# Patient Record
Sex: Male | Born: 1960 | Race: Black or African American | Hispanic: No | Marital: Single | Smoking: Never smoker
Health system: Southern US, Community
[De-identification: ages and names within clinical notes are randomized; demographics above are authoritative.]

## PROBLEM LIST (undated history)

## (undated) DIAGNOSIS — E119 Type 2 diabetes mellitus without complications: Secondary | ICD-10-CM

## (undated) DIAGNOSIS — I10 Essential (primary) hypertension: Secondary | ICD-10-CM

---

## 2000-01-18 ENCOUNTER — Encounter: Payer: Self-pay | Admitting: Occupational Medicine

## 2000-01-18 ENCOUNTER — Encounter: Admission: RE | Admit: 2000-01-18 | Discharge: 2000-01-18 | Payer: Self-pay | Admitting: Occupational Medicine

## 2002-05-24 ENCOUNTER — Other Ambulatory Visit (HOSPITAL_COMMUNITY): Admission: RE | Admit: 2002-05-24 | Discharge: 2002-06-04 | Payer: Self-pay | Admitting: Psychiatry

## 2005-08-20 ENCOUNTER — Ambulatory Visit: Payer: Self-pay | Admitting: Psychiatry

## 2005-08-20 ENCOUNTER — Other Ambulatory Visit (HOSPITAL_COMMUNITY): Admission: RE | Admit: 2005-08-20 | Discharge: 2005-11-18 | Payer: Self-pay | Admitting: Psychiatry

## 2005-09-15 ENCOUNTER — Ambulatory Visit: Payer: Self-pay | Admitting: Psychiatry

## 2014-07-08 ENCOUNTER — Encounter: Payer: Self-pay | Admitting: Emergency Medicine

## 2014-07-08 ENCOUNTER — Emergency Department
Admission: EM | Admit: 2014-07-08 | Discharge: 2014-07-08 | Disposition: A | Discharge status: Home / Self Care | Payer: Managed Care, Other (non HMO) | Source: Home / Self Care | Attending: Emergency Medicine | Admitting: Emergency Medicine

## 2014-07-08 DIAGNOSIS — I10 Essential (primary) hypertension: Secondary | ICD-10-CM

## 2014-07-08 HISTORY — DX: Essential (primary) hypertension: I10

## 2014-07-08 MED ORDER — TELMISARTAN-HCTZ 80-12.5 MG PO TABS: 1.0000 | ORAL_TABLET | Freq: Every day | ORAL | Status: DC

## 2014-07-08 NOTE — ED Provider Notes (Signed)
CSN: [id number]     Arrival date & time 07/08/14  1127 History   First MD Initiated Contact with Patient 07/08/14 1151     Chief Complaint  Patient presents with  . Blood Pressure Check   (Consider location/radiation/quality/duration/timing/severity/associated sxs/prior Treatment) Patient is a 54 y.o. male presenting with hypertension. The history is provided by the patient. No language interpreter was used.  Hypertension This is a new problem. The problem occurs constantly. The problem has been gradually worsening. Pertinent negatives include no chest pain. Nothing aggravates the symptoms. Nothing relieves the symptoms. He has tried nothing for the symptoms. The treatment provided no relief.    No past medical history on file. No past surgical history on file. No family history on file. History  Substance Use Topics  . Smoking status: Not on file  . Smokeless tobacco: Not on file  . Alcohol Use: Not on file    Review of Systems  Cardiovascular: Negative for chest pain.  All other systems reviewed and are negative.   Allergies  Review of patient's allergies indicates not on file.  Home Medications   Prior to Admission medications   Not on File   There were no vitals taken for this visit. Physical Exam  Constitutional: He is oriented to person, place, and time. He appears well-developed and well-nourished.  HENT:  Head: Normocephalic and atraumatic.  Right Ear: External ear normal.  Nose: Nose normal.  Mouth/Throat: Oropharynx is clear and moist.  Eyes: EOM are normal.  Neck: Normal range of motion.  Cardiovascular: Normal rate and normal heart sounds.   Pulmonary/Chest: Effort normal.  Abdominal: He exhibits no distension.  Musculoskeletal: Normal range of motion.  Neurological: He is alert and oriented to person, place, and time.  Psychiatric: He has a normal mood and affect.  Nursing note and vitals reviewed.   ED Course  Procedures (including critical care  time) Labs Review Labs Reviewed - No data to display  Imaging Review No results found.   MDM   1. Essential hypertension      Rx for telmisartan-hctz 80-12.5 Follow up at family practice for recheck AVS    Elson Areas, PA-C 07/08/14 1210

## 2014-07-08 NOTE — ED Notes (Signed)
Pt states he is looking for a new pcp and has ran out of BP meds. Plans to see pcp next door.

## 2014-07-08 NOTE — Discharge Instructions (Signed)
DASH Eating Plan DASH stands for "Dietary Approaches to Stop Hypertension." The DASH eating plan is a healthy eating plan that has been shown to reduce high blood pressure (hypertension). Additional health benefits may include reducing the risk of type 2 diabetes mellitus, heart disease, and stroke. The DASH eating plan may also help with weight loss. WHAT DO I NEED TO KNOW ABOUT THE DASH EATING PLAN? For the DASH eating plan, you will follow these general guidelines:  Choose foods with a percent daily value for sodium of less than 5% (as listed on the food label).  Use salt-free seasonings or herbs instead of table salt or sea salt.  Check with your health care provider or pharmacist before using salt substitutes.  Eat lower-sodium products, often labeled as "lower sodium" or "no salt added."  Eat fresh foods.  Eat more vegetables, fruits, and low-fat dairy products.  Choose whole grains. Look for the word "whole" as the first word in the ingredient list.  Choose fish and skinless chicken or turkey more often than red meat. Limit fish, poultry, and meat to 6 oz (170 g) each day.  Limit sweets, desserts, sugars, and sugary drinks.  Choose heart-healthy fats.  Limit cheese to 1 oz (28 g) per day.  Eat more home-cooked food and less restaurant, buffet, and fast food.  Limit fried foods.  Cook foods using methods other than frying.  Limit canned vegetables. If you do use them, rinse them well to decrease the sodium.  When eating at a restaurant, ask that your food be prepared with less salt, or no salt if possible. WHAT FOODS CAN I EAT? Seek help from a dietitian for individual calorie needs. Grains Whole grain or whole wheat bread. Brown rice. Whole grain or whole wheat pasta. Quinoa, bulgur, and whole grain cereals. Low-sodium cereals. Corn or whole wheat flour tortillas. Whole grain cornbread. Whole grain crackers. Low-sodium crackers. Vegetables Fresh or frozen vegetables  (raw, steamed, roasted, or grilled). Low-sodium or reduced-sodium tomato and vegetable juices. Low-sodium or reduced-sodium tomato sauce and paste. Low-sodium or reduced-sodium canned vegetables.  Fruits All fresh, canned (in natural juice), or frozen fruits. Meat and Other Protein Products Ground beef (85% or leaner), grass-fed beef, or beef trimmed of fat. Skinless chicken or turkey. Ground chicken or turkey. Pork trimmed of fat. All fish and seafood. Eggs. Dried beans, peas, or lentils. Unsalted nuts and seeds. Unsalted canned beans. Dairy Low-fat dairy products, such as skim or 1% milk, 2% or reduced-fat cheeses, low-fat ricotta or cottage cheese, or plain low-fat yogurt. Low-sodium or reduced-sodium cheeses. Fats and Oils Tub margarines without trans fats. Light or reduced-fat mayonnaise and salad dressings (reduced sodium). Avocado. Safflower, olive, or canola oils. Natural peanut or almond butter. Other Unsalted popcorn and pretzels. The items listed above may not be a complete list of recommended foods or beverages. Contact your dietitian for more options. WHAT FOODS ARE NOT RECOMMENDED? Grains White bread. White pasta. White rice. Refined cornbread. Bagels and croissants. Crackers that contain trans fat. Vegetables Creamed or fried vegetables. Vegetables in a cheese sauce. Regular canned vegetables. Regular canned tomato sauce and paste. Regular tomato and vegetable juices. Fruits Dried fruits. Canned fruit in light or heavy syrup. Fruit juice. Meat and Other Protein Products Fatty cuts of meat. Ribs, chicken wings, bacon, sausage, bologna, salami, chitterlings, fatback, hot dogs, bratwurst, and packaged luncheon meats. Salted nuts and seeds. Canned beans with salt. Dairy Whole or 2% milk, cream, half-and-half, and cream cheese. Whole-fat or sweetened yogurt. Full-fat   cheeses or blue cheese. Nondairy creamers and whipped toppings. Processed cheese, cheese spreads, or cheese  curds. Condiments Onion and garlic salt, seasoned salt, table salt, and sea salt. Canned and packaged gravies. Worcestershire sauce. Tartar sauce. Barbecue sauce. Teriyaki sauce. Soy sauce, including reduced sodium. Steak sauce. Fish sauce. Oyster sauce. Cocktail sauce. Horseradish. Ketchup and mustard. Meat flavorings and tenderizers. Bouillon cubes. Hot sauce. Tabasco sauce. Marinades. Taco seasonings. Relishes. Fats and Oils Butter, stick margarine, lard, shortening, ghee, and bacon fat. Coconut, palm kernel, or palm oils. Regular salad dressings. Other Pickles and olives. Salted popcorn and pretzels. The items listed above may not be a complete list of foods and beverages to avoid. Contact your dietitian for more information. WHERE CAN I FIND MORE INFORMATION? National Heart, Lung, and [employer] Institute: www.nhlbi.nih.gov/health/health-topics/topics/dash/ Document Released: 12/17/2010 Document Revised: 05/14/2013 Document Reviewed: 11/01/2012 ExitCare Patient Information 2015 [employer]. This information is not intended to replace advice given to you by your health care provider. Make sure you discuss any questions you have with your health care provider. Hypertension Hypertension, commonly called high blood pressure, is when the force of blood pumping through your arteries is too strong. Your arteries are the blood vessels that carry blood from your heart throughout your body. A blood pressure reading consists of a higher number over a lower number, such as 110/72. The higher number (systolic) is the pressure inside your arteries when your heart pumps. The lower number (diastolic) is the pressure inside your arteries when your heart relaxes. Ideally you want your blood pressure below 120/80. Hypertension forces your heart to work harder to pump blood. Your arteries may become narrow or stiff. Having hypertension puts you at risk for heart disease, stroke, and other problems.  RISK  FACTORS Some risk factors for high blood pressure are controllable. Others are not.  Risk factors you cannot control include:   Race. You may be at higher risk if you are African American.  Age. Risk increases with age.  Gender. Men are at higher risk than women before age 45 years. After age 65, women are at higher risk than men. Risk factors you can control include:  Not getting enough exercise or physical activity.  Being overweight.  Getting too much fat, sugar, calories, or salt in your diet.  Drinking too much alcohol. SIGNS AND SYMPTOMS Hypertension does not usually cause signs or symptoms. Extremely high blood pressure (hypertensive crisis) may cause headache, anxiety, shortness of breath, and nosebleed. DIAGNOSIS  To check if you have hypertension, your health care provider will measure your blood pressure while you are seated, with your arm held at the level of your heart. It should be measured at least twice using the same arm. Certain conditions can cause a difference in blood pressure between your right and left arms. A blood pressure reading that is higher than normal on one occasion does not mean that you need treatment. If one blood pressure reading is high, ask your health care provider about having it checked again. TREATMENT  Treating high blood pressure includes making lifestyle changes and possibly taking medicine. Living a healthy lifestyle can help lower high blood pressure. You may need to change some of your habits. Lifestyle changes may include:  Following the DASH diet. This diet is high in fruits, vegetables, and whole grains. It is low in salt, red meat, and added sugars.  Getting at least 2 hours of brisk physical activity every week.  Losing weight if necessary.  Not smoking.  Limiting   alcoholic beverages.  Learning ways to reduce stress. If lifestyle changes are not enough to get your blood pressure under control, your health care provider may  prescribe medicine. You may need to take more than one. Work closely with your health care provider to understand the risks and benefits. HOME CARE INSTRUCTIONS  Have your blood pressure rechecked as directed by your health care provider.   Take medicines only as directed by your health care provider. Follow the directions carefully. Blood pressure medicines must be taken as prescribed. The medicine does not work as well when you skip doses. Skipping doses also puts you at risk for problems.   Do not smoke.   Monitor your blood pressure at home as directed by your health care provider. SEEK MEDICAL CARE IF:   You think you are having a reaction to medicines taken.  You have recurrent headaches or feel dizzy.  You have swelling in your ankles.  You have trouble with your vision. SEEK IMMEDIATE MEDICAL CARE IF:  You develop a severe headache or confusion.  You have unusual weakness, numbness, or feel faint.  You have severe chest or abdominal pain.  You vomit repeatedly.  You have trouble breathing. MAKE SURE YOU:   Understand these instructions.  Will watch your condition.  Will get help right away if you are not doing well or get worse. Document Released: 12/28/2004 Document Revised: 05/14/2013 Document Reviewed: 10/20/2012 ExitCare Patient Information 2015 [employer]. This information is not intended to replace advice given to you by your health care provider. Make sure you discuss any questions you have with your health care provider.  

## 2014-07-25 ENCOUNTER — Ambulatory Visit: Payer: Managed Care, Other (non HMO) | Admitting: Family Medicine

## 2015-09-15 ENCOUNTER — Encounter: Payer: Self-pay | Admitting: *Deleted

## 2015-09-15 ENCOUNTER — Emergency Department
Admission: EM | Admit: 2015-09-15 | Discharge: 2015-09-15 | Disposition: A | Discharge status: Home / Self Care | Payer: BLUE CROSS/BLUE SHIELD | Source: Home / Self Care | Attending: Family Medicine | Admitting: Family Medicine

## 2015-09-15 DIAGNOSIS — Z760 Encounter for issue of repeat prescription: Secondary | ICD-10-CM | POA: Diagnosis not present

## 2015-09-15 DIAGNOSIS — I10 Essential (primary) hypertension: Secondary | ICD-10-CM

## 2015-09-15 HISTORY — DX: Type 2 diabetes mellitus without complications: E11.9

## 2015-09-15 MED ORDER — LOSARTAN POTASSIUM 100 MG PO TABS: 100.0000 mg | ORAL_TABLET | Freq: Every day | ORAL | 0 refills | Status: AC

## 2015-09-15 MED ORDER — HYDROCHLOROTHIAZIDE 25 MG PO TABS: 25.0000 mg | ORAL_TABLET | Freq: Every day | ORAL | 0 refills | Status: AC

## 2015-09-15 MED ORDER — AMLODIPINE BESYLATE 10 MG PO TABS: 10.0000 mg | ORAL_TABLET | Freq: Every day | ORAL | 0 refills | Status: AC

## 2015-09-15 NOTE — ED Triage Notes (Signed)
Pt is out of his antihypertensive meds x 2 days.  [facility]

## 2015-09-15 NOTE — ED Provider Notes (Signed)
CSN: [id number]     Arrival date & time 09/15/15  1323 History   First MD Initiated Contact with Patient 09/15/15 1352     Chief Complaint  Patient presents with  . Hypertension   (Consider location/radiation/quality/duration/timing/severity/associated sxs/prior Treatment) HPI Nicholas Boyle is a 55 y.o. male presenting to UC with request for medication refill. Pt has hx of HTN and notes he has been out of his Losartan and Amlodipine for about 2 days. [facility] but states it could still take about 5-7 business days for him to receive a refill of his medications. Denies symptoms including headache, dizziness, chest pain or SOB.   Past Medical History:  Diagnosis Date  . Diabetes mellitus without complication (HCC)   . Hypertension    History reviewed. No pertinent surgical history. History reviewed. No pertinent family history. Social History  Substance Use Topics  . Smoking status: Never Smoker  . Smokeless tobacco: Never Used  . Alcohol use Yes    Review of Systems  Eyes: Negative for photophobia, pain and visual disturbance.  Respiratory: Negative for cough, chest tightness and shortness of breath.   Cardiovascular: Negative for chest pain, palpitations and leg swelling.  Gastrointestinal: Negative for diarrhea, nausea and vomiting.  Neurological: Negative for dizziness, light-headedness and headaches.    Allergies  Review of patient's allergies indicates no known allergies.  Home Medications   Prior to Admission medications   Medication Sig Start Date End Date Taking? Authorizing Provider  metFORMIN (GLUCOPHAGE) 850 MG tablet Take 850 mg by mouth 2 (two) times daily with a meal.   Yes Historical Provider, MD  amLODipine (NORVASC) 10 MG tablet Take 1 tablet (10 mg total) by mouth daily. 09/15/15   Junius FinnerErin O'Malley, PA-C  hydrochlorothiazide (HYDRODIURIL) 25 MG tablet Take 1 tablet (25 mg total) by mouth daily. 09/15/15   Junius FinnerErin O'Malley, PA-C  hydrochlorothiazide  (MICROZIDE) 12.5 MG capsule Take 12.5 mg by mouth daily.    Historical Provider, MD  losartan (COZAAR) 100 MG tablet Take 1 tablet (100 mg total) by mouth daily. 09/15/15   Junius FinnerErin O'Malley, PA-C   Meds Ordered and Administered this Visit  Medications - No data to display  BP 147/91 (BP Location: Left Arm)   Pulse 93   Temp 97.8 F (36.6 C) (Oral)   Resp 16   Ht 6' (1.829 m)   Wt 291 lb (132 kg)   SpO2 98%   BMI 39.47 kg/m  No data found.   Physical Exam  Constitutional: He is oriented to person, place, and time. He appears well-developed and well-nourished. No distress.  Pt sitting on exam bed, NAD  HENT:  Head: Normocephalic and atraumatic.  Eyes: EOM are normal.  Neck: Normal range of motion.  Cardiovascular: Normal rate and regular rhythm.   Pulmonary/Chest: Effort normal and breath sounds normal. No respiratory distress. He has no wheezes. He has no rales.  Musculoskeletal: Normal range of motion.  Neurological: He is alert and oriented to person, place, and time.  Skin: Skin is warm and dry. He is not diaphoretic.  Psychiatric: He has a normal mood and affect. His behavior is normal.  Nursing note and vitals reviewed.   Urgent Care Course   Clinical Course    Procedures (including critical care time)  Labs Review Labs Reviewed - No data to display  Imaging Review No results found.   MDM   1. Medication refill   2. Essential hypertension    Pt requesting refill  on his BP medications. Denies having symptoms.  Pt appears well. BP- 147/91. [facility].   Refill on HCTZ 25mg , Losartan 25mg , and Amlodipine 10mg  for 14 days.     Junius Finner, PA-C 09/15/15 1423

## 2015-09-30 ENCOUNTER — Ambulatory Visit: Payer: BLUE CROSS/BLUE SHIELD | Admitting: Family Medicine

## 2015-10-07 ENCOUNTER — Ambulatory Visit: Payer: BLUE CROSS/BLUE SHIELD | Admitting: Family Medicine

## 2018-02-19 ENCOUNTER — Emergency Department (HOSPITAL_COMMUNITY): Payer: 59

## 2018-02-19 ENCOUNTER — Emergency Department (HOSPITAL_COMMUNITY)
Admission: EM | Admit: 2018-02-19 | Discharge: 2018-02-19 | Disposition: A | Discharge status: Home / Self Care | Payer: 59 | Attending: Emergency Medicine | Admitting: Emergency Medicine

## 2018-02-19 ENCOUNTER — Encounter (HOSPITAL_COMMUNITY): Payer: Self-pay | Admitting: Emergency Medicine

## 2018-02-19 DIAGNOSIS — I10 Essential (primary) hypertension: Secondary | ICD-10-CM | POA: Insufficient documentation

## 2018-02-19 DIAGNOSIS — Z7984 Long term (current) use of oral hypoglycemic drugs: Secondary | ICD-10-CM | POA: Diagnosis not present

## 2018-02-19 DIAGNOSIS — J029 Acute pharyngitis, unspecified: Secondary | ICD-10-CM | POA: Diagnosis present

## 2018-02-19 DIAGNOSIS — Z79899 Other long term (current) drug therapy: Secondary | ICD-10-CM | POA: Insufficient documentation

## 2018-02-19 DIAGNOSIS — J020 Streptococcal pharyngitis: Secondary | ICD-10-CM

## 2018-02-19 DIAGNOSIS — J36 Peritonsillar abscess: Secondary | ICD-10-CM | POA: Diagnosis not present

## 2018-02-19 DIAGNOSIS — E119 Type 2 diabetes mellitus without complications: Secondary | ICD-10-CM | POA: Diagnosis not present

## 2018-02-19 LAB — CBC WITH DIFFERENTIAL/PLATELET
Abs Immature Granulocytes: 0.19 10*3/uL — ABNORMAL HIGH (ref 0.00–0.07)
Basophils Absolute: 0.1 10*3/uL (ref 0.0–0.1)
Basophils Relative: 0 %
Eosinophils Absolute: 0 10*3/uL (ref 0.0–0.5)
Eosinophils Relative: 0 %
HCT: 43.5 % (ref 39.0–52.0)
HEMOGLOBIN: 14.5 g/dL (ref 13.0–17.0)
Immature Granulocytes: 1 %
Lymphocytes Relative: 11 %
Lymphs Abs: 2.1 10*3/uL (ref 0.7–4.0)
MCH: 28.5 pg (ref 26.0–34.0)
MCHC: 33.3 g/dL (ref 30.0–36.0)
MCV: 85.6 fL (ref 80.0–100.0)
Monocytes Absolute: 2 10*3/uL — ABNORMAL HIGH (ref 0.1–1.0)
Monocytes Relative: 10 %
Neutro Abs: 14.5 10*3/uL — ABNORMAL HIGH (ref 1.7–7.7)
Neutrophils Relative %: 78 %
Platelets: 386 10*3/uL (ref 150–400)
RBC: 5.08 MIL/uL (ref 4.22–5.81)
RDW: 12.5 % (ref 11.5–15.5)
WBC: 18.8 10*3/uL — ABNORMAL HIGH (ref 4.0–10.5)
nRBC: 0 % (ref 0.0–0.2)

## 2018-02-19 LAB — BASIC METABOLIC PANEL
Anion gap: 14 (ref 5–15)
BUN: 7 mg/dL (ref 6–20)
CO2: 21 mmol/L — ABNORMAL LOW (ref 22–32)
CREATININE: 0.83 mg/dL (ref 0.61–1.24)
Calcium: 8.9 mg/dL (ref 8.9–10.3)
Chloride: 98 mmol/L (ref 98–111)
GFR calc Af Amer: >60 mL/min (ref >60)
GFR calc non Af Amer: >60 mL/min (ref >60)
Glucose, Bld: 264 mg/dL — ABNORMAL HIGH (ref 70–99)
Potassium: 3.8 mmol/L (ref 3.5–5.1)
Sodium: 133 mmol/L — ABNORMAL LOW (ref 135–145)

## 2018-02-19 LAB — GROUP A STREP BY PCR: Group A Strep by PCR: DETECTED — AB

## 2018-02-19 MED ORDER — CLINDAMYCIN PHOSPHATE 300 MG/50ML IV SOLN
300.0000 mg | Freq: Once | INTRAVENOUS | Status: AC
  Administered 2018-02-19: 300 mg via INTRAVENOUS
  Filled 2018-02-19: qty 50

## 2018-02-19 MED ORDER — CLINDAMYCIN HCL 300 MG PO CAPS: 300.0000 mg | ORAL_CAPSULE | Freq: Four times a day (QID) | ORAL | 0 refills | Status: AC

## 2018-02-19 MED ORDER — SODIUM CHLORIDE 0.9 % IV BOLUS
1000.0000 mL | Freq: Once | INTRAVENOUS | Status: AC
  Administered 2018-02-19: 1000 mL via INTRAVENOUS

## 2018-02-19 MED ORDER — MORPHINE SULFATE (PF) 4 MG/ML IV SOLN
4.0000 mg | Freq: Once | INTRAVENOUS | Status: AC
  Administered 2018-02-19: 4 mg via INTRAVENOUS
  Filled 2018-02-19: qty 1

## 2018-02-19 MED ORDER — ONDANSETRON HCL 4 MG/2ML IJ SOLN
4.0000 mg | Freq: Once | INTRAMUSCULAR | Status: AC
  Administered 2018-02-19: 4 mg via INTRAVENOUS
  Filled 2018-02-19: qty 2

## 2018-02-19 MED ORDER — HYDROCODONE-ACETAMINOPHEN 7.5-325 MG/15ML PO SOLN: 15.0000 mL | Freq: Four times a day (QID) | ORAL | 0 refills | Status: AC | PRN

## 2018-02-19 MED ORDER — IOHEXOL 300 MG/ML  SOLN
75.0000 mL | Freq: Once | INTRAMUSCULAR | Status: AC | PRN
  Administered 2018-02-19: 75 mL via INTRAVENOUS

## 2018-02-19 MED ORDER — DEXAMETHASONE SODIUM PHOSPHATE 10 MG/ML IJ SOLN
10.0000 mg | Freq: Once | INTRAMUSCULAR | Status: AC
  Administered 2018-02-19: 10 mg via INTRAVENOUS
  Filled 2018-02-19: qty 1

## 2018-02-19 MED ORDER — CLINDAMYCIN PHOSPHATE 600 MG/50ML IV SOLN
600.0000 mg | Freq: Once | INTRAVENOUS | Status: AC
  Administered 2018-02-19: 600 mg via INTRAVENOUS
  Filled 2018-02-19: qty 50

## 2018-02-19 NOTE — ED Notes (Signed)
Patient verbalizes understanding of discharge instructions. Opportunity for questioning and answers were provided. 

## 2018-02-19 NOTE — ED Notes (Signed)
Nicholas Boyle in lab advised this Clinical research associate that BMP was hemolyzed and needs to be redrawn

## 2018-02-19 NOTE — ED Notes (Signed)
Pt provided with ice water

## 2018-02-19 NOTE — ED Notes (Signed)
Patient transported to CT 

## 2018-02-19 NOTE — Discharge Instructions (Signed)
Sure you are drinking plenty of fluids to stay hydrated and take your antibiotic 4 times a day.  You can take the pain medication syrup as needed.  Do not return to work until you are no longer needing the pain medication.

## 2018-02-19 NOTE — ED Triage Notes (Signed)
states has sore throat now  Vomited  Earlier in the week and felt weak  but has been able to eat  since Thursday, has a spot on left index finger that has been weaping

## 2018-02-19 NOTE — ED Provider Notes (Addendum)
[facility] EMERGENCY DEPARTMENT Provider Note   CSN: [id number] Arrival date & time: 02/19/18  7106     History   Chief Complaint Chief Complaint  Patient presents with  . Sore Throat  . Finger Injury    HPI Nicholas Boyle is a 58 y.o. male.  Patient is a 58 year old male with a history of hypertension and diabetes presenting today with multiple symptoms.  Patient states between Tuesday and Wednesday of this week he had severe emesis and unable to hold anything down.  For approximately 24 hours he had multiple episodes of vomiting and general malaise.  He does not think he had a fever at the time and denies any diarrhea.  However on Thursday he was starting to feel better and was eating and drinking again.  However Friday he developed a sore throat that has significantly worsened until today.  He states it is very difficult to swallow and he is having trouble swallowing but denies any shortness of breath.  The left side is more painful than the right.  He denies any chest pain, abdominal pain or any further emesis. Secondly around Tuesday also patient noticed an area of swelling and a pustule on his left index finger.  He does not know if he was bit by anything but states initially the finger swelled up and was extremely red but 2 days ago he popped the area with significant purulent drainage and his finger now feels significantly better and the swelling has completely resolved.  The history is provided by the patient.  Sore Throat  This is a new problem. The current episode started 2 days ago. The problem occurs constantly. The problem has been gradually worsening. The symptoms are aggravated by swallowing. Nothing relieves the symptoms. He has tried nothing for the symptoms. The treatment provided no relief.    Past Medical History:  Diagnosis Date  . Diabetes mellitus without complication (HCC)   . Hypertension     There are no active problems to display for this  patient.   History reviewed. No pertinent surgical history.      Home Medications    Prior to Admission medications   Medication Sig Start Date End Date Taking? Authorizing Provider  amLODipine (NORVASC) 10 MG tablet Take 1 tablet (10 mg total) by mouth daily. 09/15/15   Lurene Shadow, PA-C  hydrochlorothiazide (HYDRODIURIL) 25 MG tablet Take 1 tablet (25 mg total) by mouth daily. 09/15/15   Lurene Shadow, PA-C  hydrochlorothiazide (MICROZIDE) 12.5 MG capsule Take 12.5 mg by mouth daily.    [provider]  losartan (COZAAR) 100 MG tablet Take 1 tablet (100 mg total) by mouth daily. 09/15/15   Lurene Shadow, PA-C  metFORMIN (GLUCOPHAGE) 850 MG tablet Take 850 mg by mouth 2 (two) times daily with a meal.    [provider]    Family History No family history on file.  Social History Social History   Tobacco Use  . Smoking status: Never Smoker  . Smokeless tobacco: Never Used  Substance Use Topics  . Alcohol use: Yes  . Drug use: No     Allergies   Patient has no known allergies.   Review of Systems Review of Systems  All other systems reviewed and are negative.    Physical Exam Updated Vital Signs BP (!) 150/103   Pulse (!) 112   Temp 99.2 F (37.3 C) (Oral)   Resp 18   Ht 6\' 1"  (1.854 m)  Wt 115.7 kg   SpO2 96%   BMI 33.64 kg/m   Physical Exam Vitals signs and nursing note reviewed.  Constitutional:      General: He is not in acute distress.    Appearance: He is well-developed. He is not ill-appearing.  HENT:     Head: Normocephalic and atraumatic.     Right Ear: Tympanic membrane normal.     Left Ear: Tympanic membrane normal.     Nose: No congestion.     Mouth/Throat:     Pharynx: Pharyngeal swelling and posterior oropharyngeal erythema present.      Comments: Significant swelling and difficult visualization of the posterior pharynx.  Bulging in the left tonsillar pillar area but no uvula deviation.  No stidor but hot potato  voice Eyes:     Conjunctiva/sclera: Conjunctivae normal.     Pupils: Pupils are equal, round, and reactive to light.  Neck:     Musculoskeletal: Normal range of motion and neck supple.  Cardiovascular:     Rate and Rhythm: Regular rhythm. Tachycardia present.     Pulses: Normal pulses.     Heart sounds: No murmur.  Pulmonary:     Effort: Pulmonary effort is normal. No respiratory distress.     Breath sounds: Normal breath sounds. No wheezing or rales.  Abdominal:     General: There is no distension.     Palpations: Abdomen is soft.     Tenderness: There is no abdominal tenderness. There is no guarding or rebound.  Musculoskeletal: Normal range of motion.        General: No tenderness.       Hands:  Lymphadenopathy:     Cervical: Cervical adenopathy present.  Skin:    General: Skin is warm and dry.     Findings: No erythema or rash.  Neurological:     General: No focal deficit present.     Mental Status: He is alert and oriented to person, place, and time.  Psychiatric:        Mood and Affect: Mood normal.        Behavior: Behavior normal.      ED Treatments / Results  Labs (all labs ordered are listed, but only abnormal results are displayed) Labs Reviewed  GROUP A STREP BY PCR - Abnormal; Notable for the following components:      Result Value   Group A Strep by PCR DETECTED (*)    All other components within normal limits  CBC WITH DIFFERENTIAL/PLATELET - Abnormal; Notable for the following components:   WBC 18.8 (*)    Neutro Abs 14.5 (*)    Monocytes Absolute 2.0 (*)    Abs Immature Granulocytes 0.19 (*)    All other components within normal limits  BASIC METABOLIC PANEL - Abnormal; Notable for the following components:   Sodium 133 (*)    CO2 21 (*)    Glucose, Bld 264 (*)    All other components within normal limits    EKG None  Radiology Ct Soft Tissue Neck W Contrast  Result Date: 02/19/2018 CLINICAL DATA:  Sore throat/stridor EXAM: CT NECK WITH  CONTRAST TECHNIQUE: Multidetector CT imaging of the neck was performed using the standard protocol following the bolus administration of intravenous contrast. CONTRAST:  7mL OMNIPAQUE IOHEXOL 300 MG/ML  SOLN COMPARISON:  None. FINDINGS: Pharynx and larynx: Thickened tonsils with 13 mm left peritonsillar collection. There is preferential left-sided submucosal edema in the oropharynx that continues into the supraglottic larynx with aryepiglottic and  left lateral pharyngeal wall thickening. Mild retropharyngeal edema. Salivary glands: No inflammation, mass, or stone. Thyroid: Negative Lymph nodes: Enlarged jugular nodes without cavitation. Vascular: No significant finding. Limited intracranial: Negative Visualized orbits: Negative Mastoids and visualized paranasal sinuses: Small presumed retention cysts in the maxillary sinuses. No acute inflammation. Skeleton: Spondylosis. Upper chest: Negative IMPRESSION: 1. Tonsillitis with 13 mm left peritonsillar abscess. Submucosal edema is present in the left-sided oropharynx and supraglottic larynx. 2. Mild retropharyngeal edema. 3. Cervical adenitis. Electronically Signed   By: Marnee SpringJonathon  Watts M.D.   On: 02/19/2018 12:27    Procedures Procedures (including critical care time)  Medications Ordered in ED Medications  sodium chloride 0.9 % bolus 1,000 mL (has no administration in time range)  clindamycin (CLEOCIN) IVPB 600 mg (has no administration in time range)  ondansetron (ZOFRAN) injection 4 mg (has no administration in time range)  morphine 4 MG/ML injection 4 mg (has no administration in time range)     Initial Impression / Assessment and Plan / ED Course  I have reviewed the triage vital signs and the nursing notes.  Pertinent labs & imaging results that were available during my care of the patient were reviewed by me and considered in my medical decision making (see chart for details).     Patient presenting today with severe sore throat and  concern for possible PTA.  Lower likelihood for RPA or epiglottitis.  Patient is not stridorous but does have a mild muffled voice.  He has a temperature of 99.  He is tachycardic.  He has been having trouble swallowing but denies any shortness of breath.  Patient was given IV fluids and pain control.  He was also given a dose of IV clindamycin.  In addition he does have what appears to be a healing abscess on his left index finger but the clindamycin should also cover for that.  CBC, BMP and strep PCR pending.  We will get a CT soft tissue neck with contrast to further evaluate.  10:58 AM Pt is strep positive with leukocytosis of 18,000.  12:31 PM Patient's BMP shows a hyperglycemia of 264 but otherwise within normal limits.  Patient had a CT of the throat that showed a 13 mm left peritonsillar abscess.  Will discuss with ENT.  Patient made aware of these findings.  12:57 PM Spoke with Dr. Suszanne Connerseoh who wanted pt to have 900mg  of clindamycin so he was given 300mg  more and 300mg  qid for 10 days.  Pt also given decadron here but feels this will most likely resolve with abx.  Final Clinical Impressions(s) / ED Diagnoses   Final diagnoses:  Peritonsillar abscess  Pharyngitis due to Streptococcus species    ED Discharge Orders         Ordered    clindamycin (CLEOCIN) 300 MG capsule  4 times daily     02/19/18 1428    HYDROcodone-acetaminophen (HYCET) 7.5-325 mg/15 ml solution  4 times daily PRN     02/19/18 1428           Gwyneth SproutPlunkett, Greg Eckrich, MD 02/19/18 1233    Gwyneth SproutPlunkett, Viviann Broyles, MD 02/19/18 1523

## 2018-04-24 ENCOUNTER — Telehealth (INDEPENDENT_AMBULATORY_CARE_PROVIDER_SITE_OTHER): Payer: 59 | Admitting: Emergency Medicine

## 2018-04-24 ENCOUNTER — Encounter: Payer: Self-pay | Admitting: Emergency Medicine

## 2018-04-24 ENCOUNTER — Other Ambulatory Visit: Payer: Self-pay

## 2018-04-24 DIAGNOSIS — I10 Essential (primary) hypertension: Secondary | ICD-10-CM

## 2018-04-24 DIAGNOSIS — E1159 Type 2 diabetes mellitus with other circulatory complications: Secondary | ICD-10-CM | POA: Diagnosis not present

## 2018-04-24 DIAGNOSIS — E1165 Type 2 diabetes mellitus with hyperglycemia: Secondary | ICD-10-CM | POA: Diagnosis not present

## 2018-04-24 NOTE — Progress Notes (Signed)
Called patient for Virtual triage, he wants to establish care. Patient has hypertension and diabetes type 2, he is taking medication for this. He get his medications from the [employer]. Patient test glucose everyday sometimes, last Thursday his morning glucose before eating was 180.

## 2018-04-24 NOTE — Progress Notes (Signed)
Telemedicine Encounter- SOAP NOTE Established Patient  This telephone encounter was conducted with the patient's (or proxy's) verbal consent via audio telecommunications: yes/no: Yes Patient was instructed to have this encounter in a suitably private space; and to only have persons present to whom they give permission to participate. In addition, patient identity was confirmed by use of name plus two identifiers (DOB and address).  I discussed the limitations, risks, security and privacy concerns of performing an evaluation and management service by telephone and the availability of in person appointments. I also discussed with the patient that there may be a patient responsible charge related to this service. The patient expressed understanding and agreed to proceed.  I spent a total of TIME; 0 MIN TO 60 MIN: 20 minutes talking with the patient or their proxy.  No chief complaint on file. Establish care  Subjective   Nicholas Boyle is a [age] y.o. male.  First visit with this office.  Telephone visit today to establish care.  Has a history of hypertension and diabetes on medications.  Blood glucose at home has been between 100- 180 and blood pressure has been 129-135/78-84 range.  Compliant with medications.  Has no complaints or medical concerns today. Had colonoscopy done last September 2019 which showed a small polyp.  Told to follow-up in 3 years. Non-smoker and no EtOH abuser.  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  HPI   There are no active problems to display for this patient.   Past Medical History:  Diagnosis Date  . Diabetes mellitus without complication (HCC)   . Hypertension     Current Outpatient Medications  Medication Sig Dispense Refill  . Acetaminophen (TYLENOL PO) Take by mouth as needed.    Marland Kitchen. amLODipine (NORVASC) 10 MG tablet Take 1 tablet (10 mg total) by mouth daily. 14 tablet 0  . hydrochlorothiazide (HYDRODIURIL) 25 MG tablet Take 1  tablet (25 mg total) by mouth daily. 14 tablet 0  . losartan (COZAAR) 100 MG tablet Take 1 tablet (100 mg total) by mouth daily. 14 tablet 0  . metFORMIN (GLUCOPHAGE) 850 MG tablet Take 850 mg by mouth 2 (two) times daily with a meal.    . clindamycin (CLEOCIN) 300 MG capsule Take 1 capsule (300 mg total) by mouth 4 (four) times daily. (Patient not taking: Reported on 04/24/2018) 40 capsule 0  . hydrochlorothiazide (MICROZIDE) 12.5 MG capsule Take 12.5 mg by mouth daily.    Marland Kitchen. HYDROcodone-acetaminophen (HYCET) 7.5-325 mg/15 ml solution Take 15 mLs by mouth 4 (four) times daily as needed for moderate pain. (Patient not taking: Reported on 04/24/2018) 120 mL 0   No current facility-administered medications for this visit.     Allergies  Allergen Reactions  . Lisinopril Anaphylaxis    Per patient felt like throat was swelling up  . Atorvastatin Other (See Comments)    Back pain    Social History   Socioeconomic History  . Marital status: Single    Spouse name: Not on file  . Number of children: Not on file  . Years of education: Not on file  . Highest education level: Not on file  Occupational History  . Not on file  Social Needs  . Financial resource strain: Not on file  . Food insecurity:    Worry: Not on file    Inability: Not on file  . Transportation needs:    Medical: Not on file    Non-medical: Not on file  Tobacco Use  .  Smoking status: Never Smoker  . Smokeless tobacco: Never Used  Substance and Sexual Activity  . Alcohol use: Yes  . Drug use: No  . Sexual activity: Not on file  Lifestyle  . Physical activity:    Days per week: Not on file    Minutes per session: Not on file  . Stress: Not on file  Relationships  . Social connections:    Talks on phone: Not on file    Gets together: Not on file    Attends religious service: Not on file    Active member of club or organization: Not on file    Attends meetings of clubs or organizations: Not on file     Relationship status: Not on file  . Intimate partner violence:    Fear of current or ex partner: Not on file    Emotionally abused: Not on file    Physically abused: Not on file    Forced sexual activity: Not on file  Other Topics Concern  . Not on file  Social History Narrative  . Not on file    Review of Systems  Constitutional: Negative.  Negative for chills and fever.  HENT: Negative for congestion, nosebleeds and sore throat.   Eyes: Negative for blurred vision and double vision.  Respiratory: Negative.  Negative for cough and shortness of breath.   Cardiovascular: Negative.  Negative for chest pain and palpitations.  Gastrointestinal: Negative.  Negative for abdominal pain, diarrhea, nausea and vomiting.  Genitourinary: Negative.  Negative for dysuria and hematuria.  Musculoskeletal: Positive for back pain (Chronic back pain) and joint pain (Chronic joint pains).  Skin: Negative.  Negative for rash.  Neurological: Negative for dizziness and headaches.  All other systems reviewed and are negative.   Objective   Vitals as reported by the patient: Blood pressure: 130/80, self-reported. Awake and oriented x3 in no apparent respiratory distress while talking to me on the phone. There were no vitals filed for this visit.  There are no diagnoses linked to this encounter. Clinically stable.  No medical concerns identified during this visit.  Continue present medications.  Follow-up in 3 months. Diagnoses and all orders for this visit:  Essential hypertension  Type 2 diabetes mellitus with hyperglycemia, without long-term current use of insulin (HCC)  Hypertension associated with type 2 diabetes mellitus (HCC)    I discussed the assessment and treatment plan with the patient. The patient was provided an opportunity to ask questions and all were answered. The patient agreed with the plan and demonstrated an understanding of the instructions.   The patient was advised to call  back or seek an in-person evaluation if the symptoms worsen or if the condition fails to improve as anticipated.  I provided 20 minutes of non-face-to-face time during this encounter.  Georgina Quint, MD  Primary Care at [facility]

## 2018-07-24 ENCOUNTER — Ambulatory Visit: Payer: 59 | Admitting: Emergency Medicine

## 2018-07-25 ENCOUNTER — Encounter: Payer: Self-pay | Admitting: Emergency Medicine

## 2020-05-18 IMAGING — CT CT NECK W/ CM
5 of 6 series · 14 of 33 positions shown, 16 images · IV contrast (APPLIED)
Comparison: None.

CLINICAL DATA: Sore throat/stridor

EXAM:
CT NECK WITH CONTRAST
TECHNIQUE: Multidetector CT imaging of the neck was performed using the
standard protocol following the bolus administration of intravenous
contrast.
CONTRAST:  75mL OMNIPAQUE IOHEXOL 300 MG/ML  SOLN

[Series 3: axial neck · axial · 0.54mm/px · z∈[+1053,+1137]mm · 2 of 128 slices shown]
[im 43/128  bone]
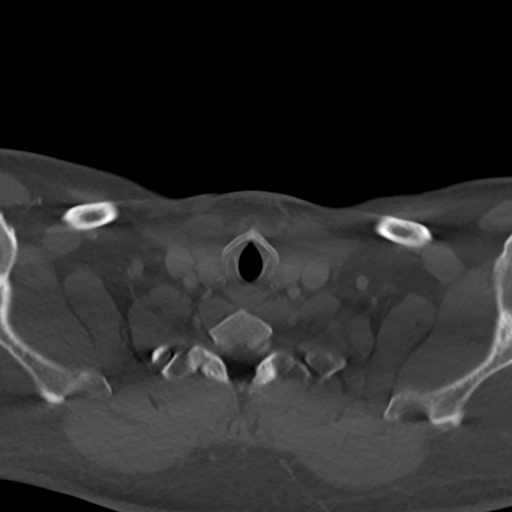
[im 85/128  bone]
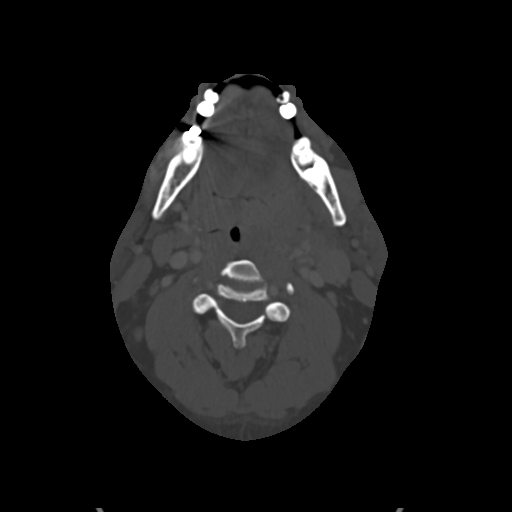

[Series 4: axial bone · axial · 0.54mm/px · z∈[+1053,+1137]mm · 2 of 128 slices shown]
[im 43/128  bone]
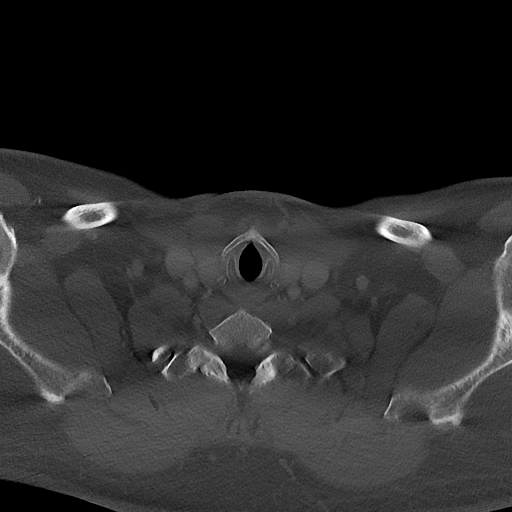
[im 85/128  bone]
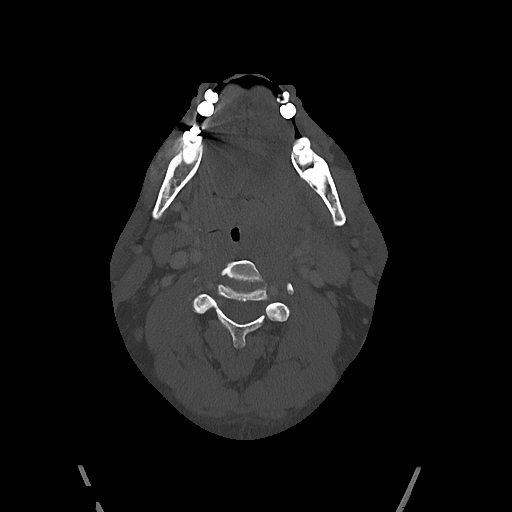

[Series 6: sag neck · sagittal · 0.53mm/px · 5 of 83 slices shown, 6 images]
[im 28/83  bone]
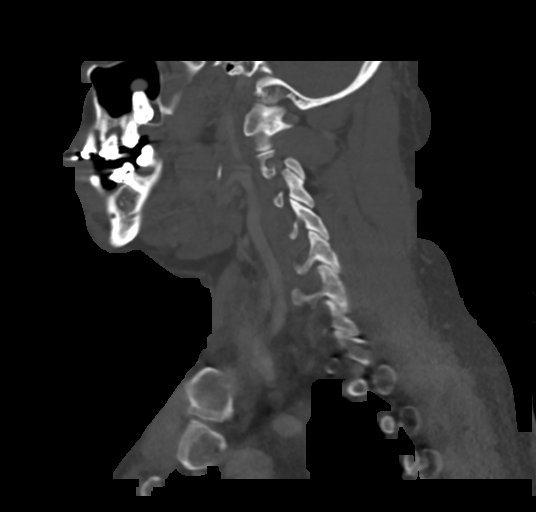
[im 35/83  bone]
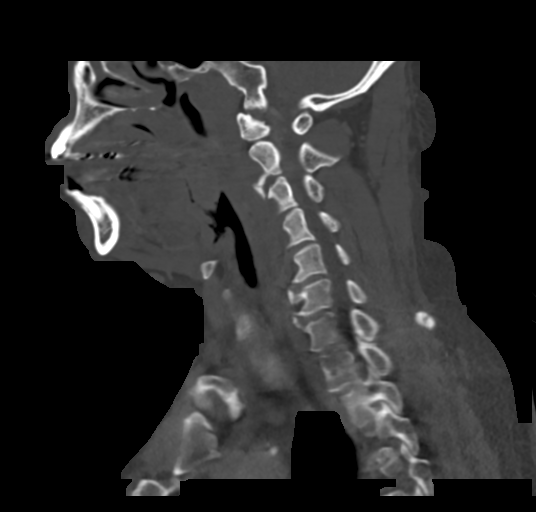
[im 42/83  soft-tissue]
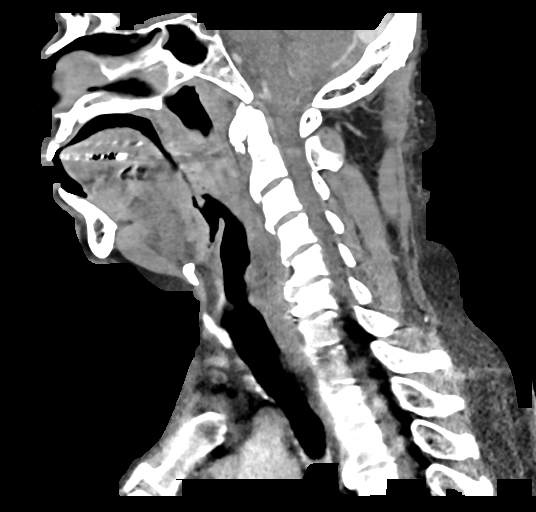
[im 42/83  bone]
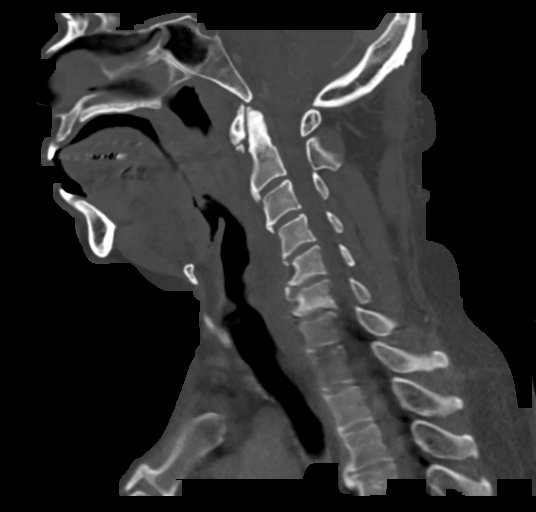
[im 48/83  bone]
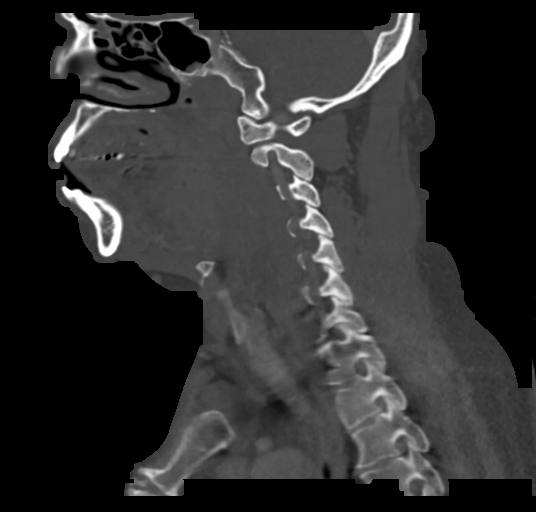
[im 55/83  bone]
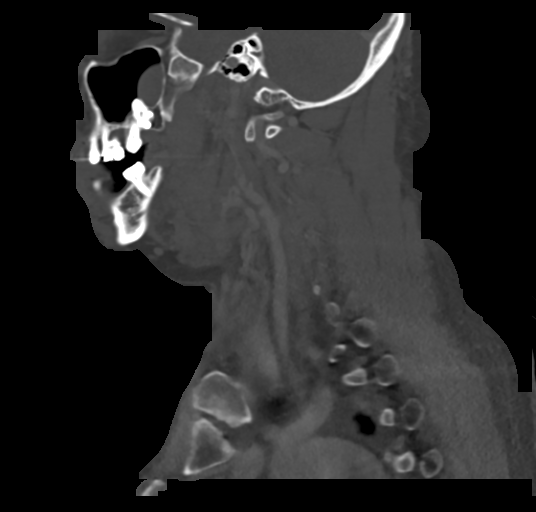

[Series 7: cor neck · coronal · 0.53mm/px · 3 of 115 slices shown]
[im 23/115  bone]
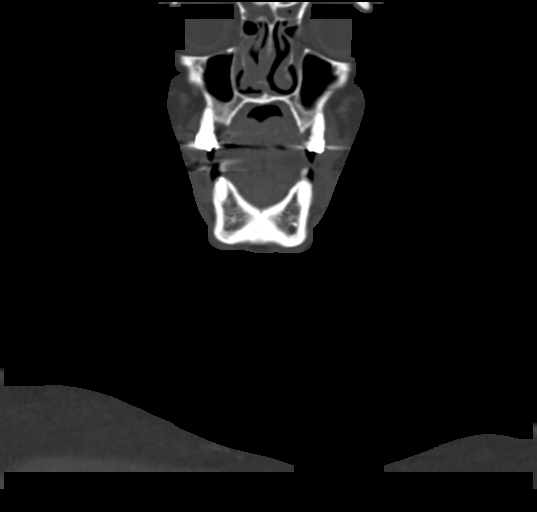
[im 46/115  bone]
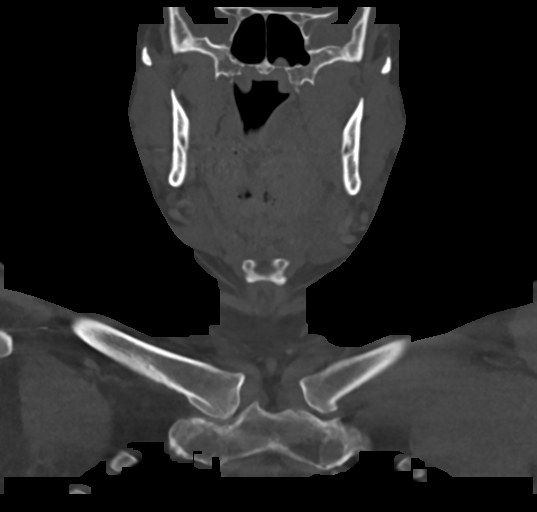
[im 69/115  bone]
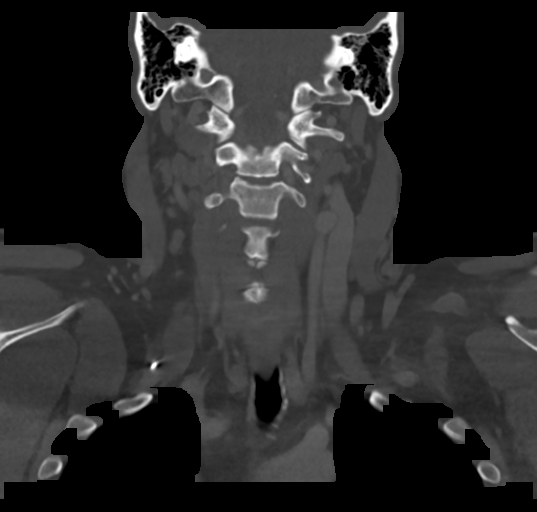

[Series 8: ax oropharynx · axial · 0.54mm/px · z∈[+1040,+1127]mm · 2 of 134 slices shown, 3 images]
[im 45/134  soft-tissue]
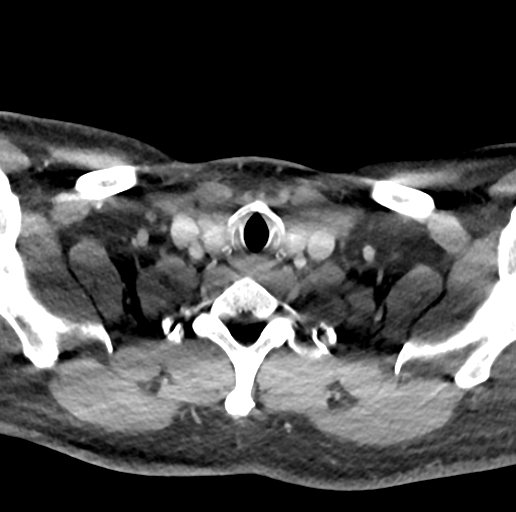
[im 45/134  bone]
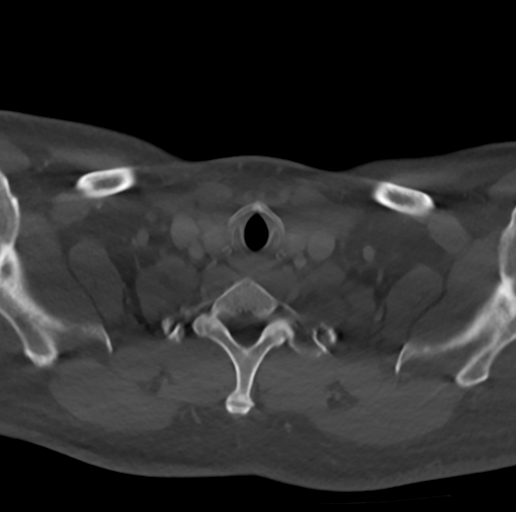
[im 89/134  bone]
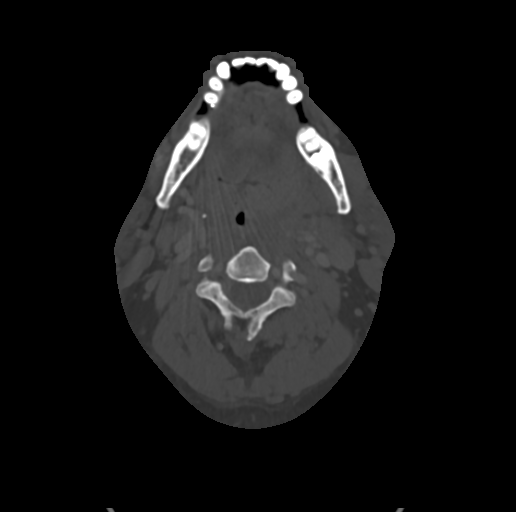

[14 of 33 positions shown; findings below may reference images not displayed]

FINDINGS: Pharynx and larynx: Thickened tonsils with 13 mm left peritonsillar
collection. There is preferential left-sided submucosal edema in the
oropharynx that continues into the supraglottic larynx with
aryepiglottic and left lateral pharyngeal wall thickening. Mild
retropharyngeal edema.

Salivary glands: No inflammation, mass, or stone.

Thyroid: Negative

Lymph nodes: Enlarged jugular nodes without cavitation.

Vascular: No significant finding.

Limited intracranial: Negative

Visualized orbits: Negative

Mastoids and visualized paranasal sinuses: Small presumed retention
cysts in the maxillary sinuses. No acute inflammation.

Skeleton: Spondylosis.

Upper chest: Negative
IMPRESSION: 1. Tonsillitis with 13 mm left peritonsillar abscess. Submucosal
edema is present in the left-sided oropharynx and supraglottic
larynx.
2. Mild retropharyngeal edema.
3. Cervical adenitis.
